# Patient Record
Sex: Male | Born: 1990 | Race: White | Hispanic: No | Marital: Single | Smoking: Current every day smoker
Health system: Southern US, Community
[De-identification: ages and names within clinical notes are randomized; demographics above are authoritative.]

## PROBLEM LIST (undated history)

## (undated) DIAGNOSIS — T7840XA Allergy, unspecified, initial encounter: Secondary | ICD-10-CM

## (undated) DIAGNOSIS — K219 Gastro-esophageal reflux disease without esophagitis: Secondary | ICD-10-CM

## (undated) HISTORY — DX: Allergy, unspecified, initial encounter: T78.40XA

---

## 2010-11-14 ENCOUNTER — Emergency Department: Payer: Self-pay | Admitting: Unknown Physician Specialty

## 2011-12-26 ENCOUNTER — Emergency Department: Payer: Self-pay | Admitting: Emergency Medicine

## 2013-10-12 ENCOUNTER — Emergency Department: Payer: Self-pay | Admitting: Emergency Medicine

## 2013-11-12 ENCOUNTER — Emergency Department: Payer: Self-pay | Admitting: Emergency Medicine

## 2014-02-14 ENCOUNTER — Emergency Department: Payer: Self-pay | Admitting: Student

## 2014-06-20 ENCOUNTER — Emergency Department: Payer: Self-pay | Admitting: Emergency Medicine

## 2014-07-13 ENCOUNTER — Emergency Department: Payer: Self-pay | Admitting: Emergency Medicine

## 2015-10-27 ENCOUNTER — Emergency Department
Admission: EM | Admit: 2015-10-27 | Discharge: 2015-10-27 | Disposition: A | Discharge status: Home / Self Care | Payer: Self-pay | Attending: Emergency Medicine | Admitting: Emergency Medicine

## 2015-10-27 DIAGNOSIS — L02414 Cutaneous abscess of left upper limb: Secondary | ICD-10-CM | POA: Insufficient documentation

## 2015-10-27 MED ORDER — IBUPROFEN 600 MG PO TABS: 600.0000 mg | ORAL_TABLET | Freq: Four times a day (QID) | ORAL | Status: DC | PRN

## 2015-10-27 MED ORDER — SULFAMETHOXAZOLE-TRIMETHOPRIM 800-160 MG PO TABS: 1.0000 | ORAL_TABLET | Freq: Two times a day (BID) | ORAL | Status: DC

## 2015-10-27 MED ORDER — TRAMADOL HCL 50 MG PO TABS: 50.0000 mg | ORAL_TABLET | Freq: Four times a day (QID) | ORAL | Status: DC | PRN

## 2015-10-27 MED ORDER — TRAMADOL HCL 50 MG PO TABS
50.0000 mg | ORAL_TABLET | Freq: Once | ORAL | Status: AC
  Administered 2015-10-27: 50 mg via ORAL
  Filled 2015-10-27: qty 1

## 2015-10-27 MED ORDER — IBUPROFEN 800 MG PO TABS
800.0000 mg | ORAL_TABLET | Freq: Once | ORAL | Status: AC
  Administered 2015-10-27: 800 mg via ORAL
  Filled 2015-10-27: qty 1

## 2015-10-27 MED ORDER — SULFAMETHOXAZOLE-TRIMETHOPRIM 800-160 MG PO TABS
1.0000 | ORAL_TABLET | Freq: Once | ORAL | Status: AC
  Administered 2015-10-27: 1 via ORAL
  Filled 2015-10-27: qty 1

## 2015-10-27 NOTE — ED Notes (Signed)
Pt ambulatory to triage with no difficulty. Pt reports about 3 days he noticed an area to his left forearm that he thought was a spider bite. Pt has red raised area to left forearm that looks infected. Pt states he tried to "pop it" and nothing came out then it started swelling up.
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
## 2015-10-27 NOTE — ED Provider Notes (Signed)
[facility] Emergency Department Provider Note   ____________________________________________  Time seen: Approximately 8:17 PM  I have reviewed the triage vital signs and the nursing notes.   HISTORY  Chief Complaint Abscess   HPI Ralph Edwards is a 25 y.o. male patient complain abscess to left lateral forearm. Patient stated 3 days ago he tried a ruptured abscess without success. Patient stated this been increased swelling since he attempted to rupture the lesion. Patient rates pain as a 5/10. No other palliative measures for his complaint. Patient described a pain as "achy". No active discharge at this time.   No past medical history on file.  There are no active problems to display for this patient.   No past surgical history on file.  Current Outpatient Rx  Name  Route  Sig  Dispense  Refill  . ibuprofen (ADVIL,MOTRIN) 600 MG tablet   Oral   Take 1 tablet (600 mg total) by mouth every 6 (six) hours as needed.   30 tablet   0   . sulfamethoxazole-trimethoprim (BACTRIM DS,SEPTRA DS) 800-160 MG tablet   Oral   Take 1 tablet by mouth 2 (two) times daily.   20 tablet   0   . traMADol (ULTRAM) 50 MG tablet   Oral   Take 1 tablet (50 mg total) by mouth every 6 (six) hours as needed for moderate pain.   12 tablet   0     Allergies Review of patient's allergies indicates no known allergies.  No family history on file.  Social History Social History  Substance Use Topics  . Smoking status: Not on file  . Smokeless tobacco: Not on file  . Alcohol Use: Not on file    Review of Systems Constitutional: No fever/chills Eyes: No visual changes. ENT: No sore throat. Cardiovascular: Denies chest pain. Respiratory: Denies shortness of breath. Gastrointestinal: No abdominal pain.  No nausea, no vomiting.  No diarrhea.  No constipation. Genitourinary: Negative for dysuria. Musculoskeletal: Negative for back pain. Skin: Negative  for rash.Papular lesions left forearm on erythematous base Neurological: Negative for headaches, focal weakness or numbness.    ____________________________________________   PHYSICAL EXAM:  VITAL SIGNS: ED Triage Vitals  Enc Vitals Group     BP 10/27/15 1933 130/81 mmHg     Pulse Rate 10/27/15 1933 76     Resp 10/27/15 1933 18     Temp 10/27/15 1933 98.1 F (36.7 C)     Temp Source 10/27/15 1933 Oral     SpO2 10/27/15 1933 99 %     Weight 10/27/15 1933 135 lb (61.236 kg)     Height 10/27/15 1933 6' (1.829 m)     Head Cir --      Peak Flow --      Pain Score 10/27/15 1934 5     Pain Loc --      Pain Edu? --      Excl. in GC? --     Constitutional: Alert and oriented. Well appearing and in no acute distress. Eyes: Conjunctivae are normal. PERRL. EOMI. Head: Atraumatic. Nose: No congestion/rhinnorhea. Mouth/Throat: Mucous membranes are moist.  Oropharynx non-erythematous. Neck: No stridor.  No cervical spine tenderness to palpation. Hematological/Lymphatic/Immunilogical: No cervical lymphadenopathy. Cardiovascular: Normal rate, regular rhythm. Grossly normal heart sounds.  Good peripheral circulation. Respiratory: Normal respiratory effort.  No retractions. Lungs CTAB. Gastrointestinal: Soft and nontender. No distention. No abdominal bruits. No CVA tenderness. Musculoskeletal: No lower extremity tenderness nor edema.  No joint  effusions. Neurologic:  Normal speech and language. No gross focal neurologic deficits are appreciated. No gait instability. Skin:  Skin is warm, dry and intact. Papular lesion on erythematous base. Lesion is nonfluctuant. Psychiatric: Mood and affect are normal. Speech and behavior are normal.  ____________________________________________   LABS (all labs ordered are listed, but only abnormal results are displayed)  Labs Reviewed - No data to  display ____________________________________________  EKG   ____________________________________________  RADIOLOGY   ____________________________________________   PROCEDURES  Procedure(s) performed: None  Critical Care performed: No  ____________________________________________   INITIAL IMPRESSION / ASSESSMENT AND PLAN / ED COURSE  Pertinent labs & imaging results that were available during my care of the patient were reviewed by me and considered in my medical decision making (see chart for details).  Abscess left forearm. Discussed with patient rationale for not I&D at this time. Patient given discharge Instructions. Patient a prescription for Bactrim DS, ibuprofen, and tramadol. Patient [facility]. ____________________________________________   FINAL CLINICAL IMPRESSION(S) / ED DIAGNOSES  Final diagnoses:  Abscess of left forearm      NEW MEDICATIONS STARTED DURING THIS VISIT:  New Prescriptions   IBUPROFEN (ADVIL,MOTRIN) 600 MG TABLET    Take 1 tablet (600 mg total) by mouth every 6 (six) hours as needed.   SULFAMETHOXAZOLE-TRIMETHOPRIM (BACTRIM DS,SEPTRA DS) 800-160 MG TABLET    Take 1 tablet by mouth 2 (two) times daily.   TRAMADOL (ULTRAM) 50 MG TABLET    Take 1 tablet (50 mg total) by mouth every 6 (six) hours as needed for moderate pain.     Note:  This document was prepared using Dragon voice recognition software and may include unintentional dictation errors.    Joni ReiningRonald K Earlisha Sharples, PA-C 10/27/15 2023  Jennye MoccasinBrian S Quigley, MD 10/27/15 2051

## 2015-10-27 NOTE — ED Notes (Signed)
Pt reports he tried to burst abscess himself apprx 3 days ago, and swelling has increased dramatically since

## 2015-10-27 NOTE — Discharge Instructions (Signed)
Abscess °An abscess (boil or furuncle) is an infected area on or under the skin. This area is filled with yellowish-white fluid (pus) and other material (debris). °HOME CARE  °· Only take medicines as told by your doctor. °· If you were given antibiotic medicine, take it as directed. Finish the medicine even if you start to feel better. °· If gauze is used, follow your doctor's directions for changing the gauze. °· To avoid spreading the infection: °¨ Keep your abscess covered with a bandage. °¨ Wash your hands well. °¨ Do not share personal care items, towels, or whirlpools with others. °¨ Avoid skin contact with others. °· Keep your skin and clothes clean around the abscess. °· Keep all doctor visits as told. °GET HELP RIGHT AWAY IF:  °· You have more pain, puffiness (swelling), or redness in the wound site. °· You have more fluid or blood coming from the wound site. °· You have muscle aches, chills, or you feel sick. °· You have a fever. °MAKE SURE YOU:  °· Understand these instructions. °· Will watch your condition. °· Will get help right away if you are not doing well or get worse. °  °This information is not intended to replace advice given to you by your health care provider. Make sure you discuss any questions you have with your health care provider. °  °Document Released: 10/29/2007 Document Revised: 11/11/2011 Document Reviewed: 07/26/2011 °Elsevier Interactive Patient Education ©2016 [employer] ° °

## 2015-10-27 NOTE — ED Notes (Signed)
Reviewed d/c instructions, follow-up care, prescriptions with pt. Pt verbalized understanding. Gave pt work note

## 2016-02-25 ENCOUNTER — Emergency Department
Admission: EM | Admit: 2016-02-25 | Discharge: 2016-02-25 | Disposition: A | Discharge status: Home / Self Care | Payer: Self-pay | Attending: Emergency Medicine | Admitting: Emergency Medicine

## 2016-02-25 ENCOUNTER — Emergency Department: Payer: Self-pay

## 2016-02-25 ENCOUNTER — Encounter: Payer: Self-pay | Admitting: *Deleted

## 2016-02-25 DIAGNOSIS — S20212A Contusion of left front wall of thorax, initial encounter: Secondary | ICD-10-CM | POA: Insufficient documentation

## 2016-02-25 DIAGNOSIS — F17200 Nicotine dependence, unspecified, uncomplicated: Secondary | ICD-10-CM | POA: Insufficient documentation

## 2016-02-25 DIAGNOSIS — X58XXXA Exposure to other specified factors, initial encounter: Secondary | ICD-10-CM | POA: Insufficient documentation

## 2016-02-25 DIAGNOSIS — Y999 Unspecified external cause status: Secondary | ICD-10-CM | POA: Insufficient documentation

## 2016-02-25 DIAGNOSIS — Y939 Activity, unspecified: Secondary | ICD-10-CM | POA: Insufficient documentation

## 2016-02-25 DIAGNOSIS — Y929 Unspecified place or not applicable: Secondary | ICD-10-CM | POA: Insufficient documentation

## 2016-02-25 MED ORDER — IBUPROFEN 800 MG PO TABS
800.0000 mg | ORAL_TABLET | Freq: Once | ORAL | Status: AC
  Administered 2016-02-25: 800 mg via ORAL
  Filled 2016-02-25: qty 1

## 2016-02-25 MED ORDER — IBUPROFEN 800 MG PO TABS: 800.0000 mg | ORAL_TABLET | Freq: Three times a day (TID) | ORAL | 0 refills | Status: DC | PRN
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
## 2016-02-25 NOTE — Discharge Instructions (Signed)
Return to the emergency department for any increase in chest pain, shortness of breath, difficult to breathing any worsening symptoms urgent changes in her health. Take ibuprofen as needed for pain.

## 2016-11-03 ENCOUNTER — Ambulatory Visit (INDEPENDENT_AMBULATORY_CARE_PROVIDER_SITE_OTHER): Payer: Self-pay

## 2016-11-03 ENCOUNTER — Ambulatory Visit
Admission: EM | Admit: 2016-11-03 | Discharge: 2016-11-03 | Disposition: A | Discharge status: Home / Self Care | Payer: Self-pay | Attending: Family Medicine | Admitting: Family Medicine

## 2016-11-03 DIAGNOSIS — K219 Gastro-esophageal reflux disease without esophagitis: Secondary | ICD-10-CM

## 2016-11-03 DIAGNOSIS — R197 Diarrhea, unspecified: Secondary | ICD-10-CM

## 2016-11-03 DIAGNOSIS — M7918 Myalgia, other site: Secondary | ICD-10-CM

## 2016-11-03 DIAGNOSIS — E876 Hypokalemia: Secondary | ICD-10-CM

## 2016-11-03 DIAGNOSIS — M791 Myalgia: Secondary | ICD-10-CM

## 2016-11-03 DIAGNOSIS — R109 Unspecified abdominal pain: Secondary | ICD-10-CM

## 2016-11-03 HISTORY — DX: Gastro-esophageal reflux disease without esophagitis: K21.9

## 2016-11-03 LAB — CBC WITH DIFFERENTIAL/PLATELET
BASOS PCT: 0 %
Basophils Absolute: 0 10*3/uL (ref 0–0.1)
EOS ABS: 0.1 10*3/uL (ref 0–0.7)
Eosinophils Relative: 1 %
HCT: 47.4 % (ref 40.0–52.0)
HEMOGLOBIN: 16.1 g/dL (ref 13.0–18.0)
Lymphocytes Relative: 25 %
Lymphs Abs: 2.4 10*3/uL (ref 1.0–3.6)
MCH: 30.4 pg (ref 26.0–34.0)
MCHC: 34 g/dL (ref 32.0–36.0)
MCV: 89.4 fL (ref 80.0–100.0)
MONOS PCT: 7 %
Monocytes Absolute: 0.7 10*3/uL (ref 0.2–1.0)
NEUTROS PCT: 67 %
Neutro Abs: 6.6 10*3/uL — ABNORMAL HIGH (ref 1.4–6.5)
PLATELETS: 284 10*3/uL (ref 150–440)
RBC: 5.3 MIL/uL (ref 4.40–5.90)
RDW: 13.3 % (ref 11.5–14.5)
WBC: 9.8 10*3/uL (ref 3.8–10.6)

## 2016-11-03 LAB — COMPREHENSIVE METABOLIC PANEL
ALBUMIN: 4.5 g/dL (ref 3.5–5.0)
ALK PHOS: 76 U/L (ref 38–126)
ALT: 16 U/L — ABNORMAL LOW (ref 17–63)
ANION GAP: 11 (ref 5–15)
AST: 22 U/L (ref 15–41)
BUN: 10 mg/dL (ref 6–20)
CALCIUM: 9 mg/dL (ref 8.9–10.3)
CHLORIDE: 100 mmol/L — AB (ref 101–111)
CO2: 25 mmol/L (ref 22–32)
Creatinine, Ser: 0.91 mg/dL (ref 0.61–1.24)
GFR calc Af Amer: >60 mL/min (ref >60)
GFR calc non Af Amer: >60 mL/min (ref >60)
GLUCOSE: 104 mg/dL — AB (ref 65–99)
Potassium: 3.4 mmol/L — ABNORMAL LOW (ref 3.5–5.1)
SODIUM: 136 mmol/L (ref 135–145)
Total Bilirubin: 1.5 mg/dL — ABNORMAL HIGH (ref 0.3–1.2)
Total Protein: 7.7 g/dL (ref 6.5–8.1)

## 2016-11-03 MED ORDER — POTASSIUM CHLORIDE CRYS ER 10 MEQ PO TBCR
10.0000 meq | EXTENDED_RELEASE_TABLET | Freq: Once | ORAL | Status: AC
  Administered 2016-11-03: 10 meq via ORAL

## 2016-11-03 MED ORDER — OMEPRAZOLE 20 MG PO CPDR: 20.0000 mg | DELAYED_RELEASE_CAPSULE | Freq: Every day | ORAL | 0 refills | Status: DC

## 2016-11-03 NOTE — Discharge Instructions (Signed)
Take medication as prescribed. Rest. Drink plenty of fluids. Eat healthy diet. Avoid acid reflux triggers.   Follow up with your primary care physician for recheck of labs of bilirubin and potassium and follow up, as discussed. Return to Urgent care or Emergency room for new or worsening concerns.

## 2016-11-03 NOTE — ED Triage Notes (Addendum)
Pt c/o yellowing of the eyes, and his upper left abdomen is tender and he mentions that it appears to be swollen to him. His last BM was this morning and he states it was diarrhea and he has had diarrhea the last couple days.

## 2016-11-03 NOTE — ED Provider Notes (Signed)
[facility] ____________________________________________  Time seen: Approximately 9:04 PM  I have reviewed the triage vital signs and the nursing notes.   HISTORY  Chief Complaint Abdominal Pain   HPI Ralph Edwards is a 26 y.o. male  presenting with friend at bedside for evaluation of left upper abdominal pain in the location of what he describes as a visible bulge present present 1 day. Patient states that currently he is not having any pain and states currently he feels fine. States that this causes him a lot of pain earlier today and resolved after he left work. Denies aggravating or alleviating factors. Reports he did have 2 episodes of loose stool today, states it is normal-appearing, no melena, hematochezia or abnormal colors. No nausea or vomiting. Patient has not taken anything over-the-counter today. States currently he is pain-free and feels fine. Denies any other diarrheal episodes.  Patient states that he was having left upper abdominal pain and states that he noticed a bulge and states that the pain was the only area of pain. States that it hurt directly on the bulge and nowhere else. Denies any fall, injury or trauma to chest or abdomen or elsewhere. States he noticed similar about a month ago which resolved after a day without any intervention. Patient reports a month ago he had pain when he noticed the bulge, reports the pain went away after a day, but the bulge never resolved. Reports drank fluids well today, slight decrease in appetite. Reports knows he does not drink as much water as he needs to. Patient states that today he started to look up his symptoms on line and became concerned when he pulled on his bilateral lower eyelids and concern of a yellowish coloration that he has not previously noticed.  Patient also reports that he has had acid reflux issues for several years. Patient reports he usually has a lot of belching and burping of the acidic  tasting material in his mouth, worse in the morning and after eating acidic foods. Has never previously take any medications for this. States no acute worsening of this.  Denies chest pain, shortness of breath, dysuria, extremity pain, extremity swelling or rash. Denies recent sickness. Denies recent antibiotic use.    Past Medical History:  Diagnosis Date  . Acid reflux     There are no active problems to display for this patient.   History reviewed. No pertinent surgical history.   No current facility-administered medications for this encounter.   Current Outpatient Prescriptions:   .  omeprazole (PRILOSEC) 20 MG capsule, Take 1 capsule (20 mg total) by mouth daily., Disp: 30 capsule, Rfl: 0  Allergies Patient has no known allergies.   family history Dad; acid reflux  Social History Social History  Substance Use Topics  . Smoking status: Current Every Day Smoker  . Smokeless tobacco: Never Used  . Alcohol use Yes    Review of Systems Constitutional: No fever/chills Eyes: No visual changes. ENT: No sore throat. Cardiovascular: Denies chest pain. Respiratory: Denies shortness of breath. Gastrointestinal: As above.No constipation. Genitourinary: Negative for dysuria. Musculoskeletal: Negative for back pain. Skin: Negative for rash.  ____________________________________________   PHYSICAL EXAM:  VITAL SIGNS: ED Triage Vitals  Enc Vitals Group     BP 11/03/16 1933 134/81     Pulse Rate 11/03/16 1933 94     Resp 11/03/16 1933 18     Temp 11/03/16 1933 98.3 F (36.8 C)     Temp Source 11/03/16 1933 Oral  SpO2 11/03/16 1933 100 %     Weight 11/03/16 1933 145 lb (65.8 kg)     Height 11/03/16 1933 6' (1.829 m)     Head Circumference --      Peak Flow --      Pain Score 11/03/16 1935 9     Pain Loc --      Pain Edu? --      Excl. in GC? --     Constitutional: Alert and oriented. Well appearing and in no acute distress. Eyes: Conjunctivae are normal.  No injection. No drainage. No swelling or tenderness bilaterally. Bilaterally- minimal yellowish coloration present when lower eyelids pulled downwards, no other yellowish discoloration. No asymmetry noted.  ENT      Head: Normocephalic and atraumatic.      Nose: No congestion/rhinnorhea.      Mouth/Throat: Mucous membranes are moist.Oropharynx non-erythematous. Neck: No stridor. Supple without meningismus.  Hematological/Lymphatic/Immunilogical: No cervical lymphadenopathy. Cardiovascular: Normal rate, regular rhythm. Grossly normal heart sounds.  Good peripheral circulation. Respiratory: Normal respiratory effort without tachypnea nor retractions. Breath sounds are clear and equal bilaterally. No wheezes, rales, rhonchi. Gastrointestinal: Soft and nontender. No distention. Normal Bowel sounds. No CVA tenderness. Musculoskeletal:   No midline cervical, thoracic or lumbar tenderness to palpation.  Chest nontender to palpation. No rib tenderness noted.  Left upper abdomen just left of xiphoid process minimal asymmetry noted of abdominal musculature left larger than right, area nontender, no point bony tenderness, no mass palpated, no erythema, no rash, minimal asymmetry also noted with patient flexing abdominal musculature with no tenderness noted.   Neurologic:  Normal speech and language. Speech is normal. No gait instability.  Skin:  Skin is warm, dry Psychiatric: Mood and affect are normal. Speech and behavior are normal. Patient exhibits appropriate insight and judgment   ___________________________________________   LABS (all labs ordered are listed, but only abnormal results are displayed)  Labs Reviewed  CBC WITH DIFFERENTIAL/PLATELET - Abnormal; Notable for the following:       Result Value   Neutro Abs 6.6 (*)    All other components within normal limits  COMPREHENSIVE METABOLIC PANEL - Abnormal; Notable for the following:    Potassium 3.4 (*)    Chloride 100 (*)    Glucose, Bld  104 (*)    ALT 16 (*)    Total Bilirubin 1.5 (*)    All other components within normal limits    RADIOLOGY  Dg Chest 2 View  Result Date: 11/03/2016 CLINICAL DATA:  Lower mid anterior left rib pain EXAM: CHEST  2 VIEW COMPARISON:  02/25/2016 FINDINGS: The lungs are clear without focal pneumonia, edema, pneumothorax or pleural effusion. Biapical pleuroparenchymal scarring is stable. The cardiopericardial silhouette is within normal limits for size. The visualized bony structures of the thorax are intact. IMPRESSION: Stable exam.  No acute cardiopulmonary findings. Electronically Signed   By: Kennith CenterEric  Mansell M.D.   On: 11/03/2016 20:49   Dg Abdomen 1 View  Result Date: 11/03/2016 CLINICAL DATA:  Upper abdominal pain and lower mid anterior rib pain 1 month. Diarrhea beginning today. Some shortness-of-breath. EXAM: ABDOMEN - 1 VIEW COMPARISON:  None. FINDINGS: Bowel gas pattern is nonobstructive. No free peritoneal air. No mass or mass effect. Few tiny pelvic calcifications likely phleboliths. Bony structures are normal. IMPRESSION: Nonobstructive bowel gas pattern. Electronically Signed   By: Elberta Fortisaniel  Boyle M.D.   On: 11/03/2016 20:35   ____________________________________________   PROCEDURES Procedures    INITIAL IMPRESSION / ASSESSMENT AND PLAN /  ED COURSE  Pertinent labs & imaging results that were available during my care of the patient were reviewed by me and considered in my medical decision making (see chart for details).  Very well-appearing patient. No acute distress. Stable appearing. Patient denies any complaints at this time is currently asymptomatic per patient. Patient expressed concern over pain and appearance of asymmetrical bulge and left upper abdomen. Minimal asymmetry noted to left abdominal musculature immediately left of xiphoid process, nontender and no mass palpated. 2 episodes of loose stool today. Patient with history of chronic acid reflux symptoms, not previously  only medications. Chest and abdominal x-ray results per radiologist as above. Labs reviewed and discussed in detail with patient and family. Bilirubin 1.5, counseled regarding monitor, increase water intake as patient states has not been drinking water as normal and follow-up and lab in 1 week. Potassium 3.4, 10 mEq potassium given once in urgent care, encouraged dietary adjustments and monitoring diarrhea. Suspect asymmetry noted to be muscle process. Also as patient with chronic history of acid reflux symptoms without currently medications will start patient on oral daily omeprazole. Encourage rest, fluids, supportive care. Encouraged primary follow-up in one week, discussed sooner return parameters with patient and friend.Discussed indication, risks and benefits of medications with patient.   Discussed follow up with Primary care physician this week. Discussed follow up and return parameters including no resolution or any worsening concerns. Patient verbalized understanding and agreed to plan.   ____________________________________________   FINAL CLINICAL IMPRESSION(S) / ED DIAGNOSES  Final diagnoses:  Abdominal muscle pain  Diarrhea, unspecified type  Hypokalemia  Hyperbilirubinemia  Gastroesophageal reflux disease, esophagitis presence not specified     Discharge Medication List as of 11/03/2016  8:59 PM    START taking these medications   Details  omeprazole (PRILOSEC) 20 MG capsule Take 1 capsule (20 mg total) by mouth daily., Starting Mon 11/03/2016, Until Wed 12/03/2016, Normal        Note: This dictation was prepared with Dragon dictation along with smaller phrase technology. Any transcriptional errors that result from this process are unintentional.         Renford Dills, NP 11/03/16 2128

## 2017-06-15 ENCOUNTER — Other Ambulatory Visit: Payer: Self-pay

## 2017-06-15 ENCOUNTER — Encounter: Payer: Self-pay | Admitting: Emergency Medicine

## 2017-06-15 DIAGNOSIS — F17200 Nicotine dependence, unspecified, uncomplicated: Secondary | ICD-10-CM | POA: Insufficient documentation

## 2017-06-15 DIAGNOSIS — J020 Streptococcal pharyngitis: Secondary | ICD-10-CM | POA: Insufficient documentation

## 2017-06-15 DIAGNOSIS — J069 Acute upper respiratory infection, unspecified: Secondary | ICD-10-CM | POA: Insufficient documentation

## 2017-06-15 DIAGNOSIS — R0981 Nasal congestion: Secondary | ICD-10-CM | POA: Insufficient documentation

## 2017-06-15 NOTE — ED Triage Notes (Signed)
Patient ambulatory to triage with steady gait, without difficulty or distress noted; pt reports since Friday having sinus congestion and yellow prod cough

## 2017-06-16 ENCOUNTER — Emergency Department: Payer: Self-pay

## 2017-06-16 ENCOUNTER — Emergency Department
Admission: EM | Admit: 2017-06-16 | Discharge: 2017-06-16 | Disposition: A | Discharge status: Home / Self Care | Payer: Self-pay | Attending: Emergency Medicine | Admitting: Emergency Medicine

## 2017-06-16 DIAGNOSIS — J069 Acute upper respiratory infection, unspecified: Secondary | ICD-10-CM

## 2017-06-16 DIAGNOSIS — J020 Streptococcal pharyngitis: Secondary | ICD-10-CM

## 2017-06-16 LAB — INFLUENZA PANEL BY PCR (TYPE A & B)
INFLBPCR: NEGATIVE
Influenza A By PCR: NEGATIVE

## 2017-06-16 LAB — GROUP A STREP BY PCR: Group A Strep by PCR: DETECTED — AB

## 2017-06-16 MED ORDER — IBUPROFEN 600 MG PO TABS
600.0000 mg | ORAL_TABLET | Freq: Once | ORAL | Status: AC
  Administered 2017-06-16: 600 mg via ORAL
  Filled 2017-06-16: qty 1

## 2017-06-16 MED ORDER — AMOXICILLIN 875 MG PO TABS: 875.0000 mg | ORAL_TABLET | Freq: Two times a day (BID) | ORAL | 10 refills | Status: AC

## 2017-06-16 MED ORDER — AMOXICILLIN 500 MG PO CAPS
1000.0000 mg | ORAL_CAPSULE | Freq: Once | ORAL | Status: AC
  Administered 2017-06-16: 1000 mg via ORAL
  Filled 2017-06-16: qty 2

## 2017-06-16 NOTE — Discharge Instructions (Signed)
Please follow up with your primary care physician or the acute care clinic

## 2017-06-16 NOTE — ED Provider Notes (Signed)
[facility] Emergency Department Provider Note   ____________________________________________   First MD Initiated Contact with Patient 06/16/17 [id number]     (approximate)  I have reviewed the triage vital signs and the nursing notes.   HISTORY  Chief Complaint Cough and Nasal Congestion    HPI Ralph Edwards is a 27 y.o. male who comes into the hospital today with some coughing and nasal congestion.  He reports that his cough is been productive of yellow phlegm.  He has had some body aches and sore throat as well.  He felt hot earlier but did not take his temperature.  The patient states that he has been unable to get out of bed.  His cough started on Tuesday but he reports that the symptoms got worse in the last couple of days.  He also states that he has a knot on his throat for the last 6 months and he reports that he has lost some weight.  He has some swollen lymph nodes.  The patient does not have a primary care physician.  He has been taking TheraFlu which she reports works but he still states that his pain is a 6 out of 10 in intensity.  The patient is here today for evaluation.  Past Medical History:  Diagnosis Date  . Acid reflux     There are no active problems to display for this patient.   History reviewed. No pertinent surgical history.  Prior to Admission medications   Medication Sig Start Date End Date Taking? Authorizing Provider  amoxicillin (AMOXIL) 500 MG tablet Take 500 mg by mouth 3 (three) times daily.    [provider]  amoxicillin (AMOXIL) 875 MG tablet Take 1 tablet (875 mg total) by mouth 2 (two) times daily. 06/16/17   Rebecka ApleyWebster, Johnnye Sandford P, MD  omeprazole (PRILOSEC) 20 MG capsule Take 1 capsule (20 mg total) by mouth daily. 11/03/16 12/03/16  Renford DillsMiller, Lindsey, NP    Allergies Patient has no known allergies.  No family history on file.  Social History Social History   Tobacco Use  . Smoking status: Current  Every Day Smoker  . Smokeless tobacco: Never Used  Substance Use Topics  . Alcohol use: Yes  . Drug use: Not on file    Review of Systems  Constitutional:  fever/chills Eyes: No visual changes. ENT:  sore throat. Cardiovascular: Denies chest pain. Respiratory: cough and shortness of breath. Gastrointestinal: No abdominal pain.  No nausea, no vomiting.  No diarrhea.  No constipation. Genitourinary: Negative for dysuria. Musculoskeletal: body aches. Skin: Negative for rash. Neurological: headache   ____________________________________________   PHYSICAL EXAM:  VITAL SIGNS: ED Triage Vitals  Enc Vitals Group     BP 06/15/17 2354 (!) 138/91     Pulse Rate 06/15/17 2354 100     Resp 06/15/17 2354 20     Temp 06/15/17 2354 97.8 F (36.6 C)     Temp Source 06/15/17 2354 Oral     SpO2 06/15/17 2354 100 %     Weight 06/15/17 2355 140 lb (63.5 kg)     Height 06/15/17 2355 6' (1.829 m)     Head Circumference --      Peak Flow --      Pain Score 06/16/17 0306 0     Pain Loc --      Pain Edu? --      Excl. in GC? --     Constitutional: Alert and oriented. Well appearing and in moderate  distress. Eyes: Conjunctivae are normal. PERRL. EOMI. Head: Atraumatic. Nose: No congestion/rhinnorhea. Mouth/Throat: Mucous membranes are moist.  Oropharynx mildly erythematous with no exudate Hematological/Lymphatic/Immunilogical:  cervical lymphadenopathy. Cardiovascular: Normal rate, regular rhythm. Grossly normal heart sounds.  Good peripheral circulation. Respiratory: Normal respiratory effort.  No retractions. Lungs CTAB. Gastrointestinal: Soft and nontender. No distention. No abdominal bruits.  Musculoskeletal: No lower extremity tenderness nor edema.   Neurologic:  Normal speech and language.  Skin:  Skin is warm, dry and intact.  Psychiatric: Mood and affect are normal.   ____________________________________________   LABS (all labs ordered are listed, but only abnormal  results are displayed)  Labs Reviewed  GROUP A STREP BY PCR - Abnormal; Notable for the following components:      Result Value   Group A Strep by PCR DETECTED (*)    All other components within normal limits  INFLUENZA PANEL BY PCR (TYPE A & B)   ____________________________________________  EKG  none ____________________________________________  RADIOLOGY  Dg Chest 2 View  Result Date: 06/16/2017 CLINICAL DATA:  Cough EXAM: CHEST  2 VIEW COMPARISON:  Chest radiograph 11/03/2016 FINDINGS: The heart size and mediastinal contours are within normal limits. Both lungs are clear. The visualized skeletal structures are unremarkable. IMPRESSION: No active cardiopulmonary disease. Electronically Signed   By: Deatra Robinson M.D.   On: 06/16/2017 00:21    ____________________________________________   PROCEDURES  Procedure(s) performed: None  Procedures  Critical Care performed: No  ____________________________________________   INITIAL IMPRESSION / ASSESSMENT AND PLAN / ED COURSE  As part of my medical decision making, I reviewed the following data within the electronic MEDICAL RECORD NUMBER Notes from prior ED visits and [place] Controlled Substance Database   This is a 27 year old male who comes into the hospital today with some coughing and nasal congestion.  The patient has some sore throat and some body aches.  My differential diagnosis includes viral upper respiratory infection, influenza, strep throat, pneumonia  The patient was sent for a chest x-ray but it did not show a pneumonia.  I will order a flu swab and strep swab.  The patient will receive a dose of ibuprofen.  His vital signs are unremarkable.  He will be reassessed once I received his results.     The patient strep came back positive.  His influenza was negative.  I gave the patient a dose of amoxicillin.  He will be discharged home to follow-up with the acute care  clinic. ____________________________________________   FINAL CLINICAL IMPRESSION(S) / ED DIAGNOSES  Final diagnoses:  Strep throat  Acute upper respiratory infection     ED Discharge Orders        Ordered    amoxicillin (AMOXIL) 875 MG tablet  2 times daily     06/16/17 0502       Note:  This document was prepared using Dragon voice recognition software and may include unintentional dictation errors.    Rebecka Apley, MD 06/16/17 [phone]

## 2020-09-27 ENCOUNTER — Other Ambulatory Visit: Payer: Self-pay

## 2020-09-27 ENCOUNTER — Ambulatory Visit: Payer: Self-pay | Admitting: Family Medicine

## 2020-09-27 ENCOUNTER — Encounter: Payer: Self-pay | Admitting: Family Medicine

## 2020-09-27 DIAGNOSIS — Z113 Encounter for screening for infections with a predominantly sexual mode of transmission: Secondary | ICD-10-CM

## 2020-09-27 LAB — GRAM STAIN

## 2020-09-27 NOTE — Progress Notes (Signed)
Gram Stain results reviewed.  No treatment needed per SO. Berdie Ogren, RN

## 2020-09-27 NOTE — Progress Notes (Signed)
   [facility] Department STI clinic/screening visit  Subjective:  Ralph Edwards is a 30 y.o. male being seen today for an STI screening visit. The patient reports they do not have symptoms.    Patient has the following medical conditions:  There are no problems to display for this patient.    Chief Complaint  Patient presents with  . SEXUALLY TRANSMITTED DISEASE    screening    HPI  Patient reports here for screening denies s/sx    See flowsheet for further details and programmatic requirements.    The following portions of the patient's history were reviewed and updated as appropriate: allergies, current medications, past medical history, past social history, past surgical history and problem list.  Objective:  There were no vitals filed for this visit.  Physical Exam Constitutional:      Appearance: Normal appearance.  HENT:     Head: Normocephalic.     Mouth/Throat:     Mouth: Mucous membranes are moist.     Dentition: Normal dentition.     Pharynx: Oropharynx is clear. No oropharyngeal exudate.  Pulmonary:     Effort: Pulmonary effort is normal.  Genitourinary:    Penis: Normal.      Testes: Normal.     Comments: No lice, nits, or pest, no lesions or odor discharge.  Denies pain or tenderness with paplation of testicles.  No lesions, ulcers or masses present.    Musculoskeletal:     Cervical back: Normal range of motion.  Lymphadenopathy:     Cervical: No cervical adenopathy.  Skin:    General: Skin is warm and dry.     Findings: No bruising, erythema, lesion or rash.  Neurological:     Mental Status: He is alert and oriented to person, place, and time.  Psychiatric:        Mood and Affect: Mood normal.        Behavior: Behavior normal.       Assessment and Plan:  Ralph Edwards is a 30 y.o. male presenting to the [facility] Department for STI screening  1. Screening examination for venereal disease  -  HIV/HCV [place] [facility] - Syphilis Serology, [facility] - Gonococcus culture - Gram stain  Patient does not have STI symptoms Patient accepted all screenings including  Gram stain, urethral GC and bloodwork for HIV/RPR.  Patient meets criteria for HepB screening? No. Ordered? No -  does not meet critera  Patient meets criteria for HepC screening? Yes. Ordered? Yes Recommended condom use with all sex Discussed importance of condom use for STI prevent  RN: Treat gram stain per standing order Discussed time line for [facility] results and that patient will be called with positive results and encouraged patient to call if he had not heard in 2 weeks Recommended returning for continued or worsening symptoms.    No follow-ups on file.  No future appointments.  Wendi Snipes, FNP

## 2020-10-01 LAB — GONOCOCCUS CULTURE

## 2020-10-02 LAB — HM HEPATITIS C SCREENING LAB: HM Hepatitis Screen: NEGATIVE

## 2020-10-02 LAB — HM HIV SCREENING LAB: HM HIV Screening: NEGATIVE

## 2020-10-18 ENCOUNTER — Telehealth: Payer: Self-pay | Admitting: Family Medicine

## 2020-10-18 NOTE — Telephone Encounter (Signed)
Call to client and busy signal heard. RN will re-attempt call in 10 minutes. Jossie Ng, RN

## 2020-10-18 NOTE — Telephone Encounter (Signed)
Call to number of record and once again immediate busy signal heard. Jossie Ng, RN

## 2020-10-18 NOTE — Telephone Encounter (Signed)
I need my lab results.

## 2020-10-19 ENCOUNTER — Telehealth: Payer: Self-pay

## 2021-05-06 ENCOUNTER — Ambulatory Visit: Payer: BC Managed Care – PPO | Admitting: Nurse Practitioner

## 2021-05-06 ENCOUNTER — Telehealth: Payer: Self-pay

## 2021-05-06 ENCOUNTER — Other Ambulatory Visit: Payer: Self-pay

## 2021-05-06 ENCOUNTER — Encounter: Payer: Self-pay | Admitting: Nurse Practitioner

## 2021-05-06 VITALS — BP 110/72 | HR 60 | Temp 98.5°F | Resp 18 | Ht 72.0 in | Wt 147.0 lb

## 2021-05-06 DIAGNOSIS — K219 Gastro-esophageal reflux disease without esophagitis: Secondary | ICD-10-CM

## 2021-05-06 DIAGNOSIS — E041 Nontoxic single thyroid nodule: Secondary | ICD-10-CM | POA: Diagnosis not present

## 2021-05-06 DIAGNOSIS — Z131 Encounter for screening for diabetes mellitus: Secondary | ICD-10-CM

## 2021-05-06 DIAGNOSIS — J309 Allergic rhinitis, unspecified: Secondary | ICD-10-CM | POA: Diagnosis not present

## 2021-05-06 DIAGNOSIS — Z130 Encounter for screening for diseases of the blood and blood-forming organs and certain disorders involving the immune mechanism: Secondary | ICD-10-CM

## 2021-05-06 DIAGNOSIS — Z7689 Persons encountering health services in other specified circumstances: Secondary | ICD-10-CM

## 2021-05-06 MED ORDER — OMEPRAZOLE 20 MG PO CPDR: 20.0000 mg | DELAYED_RELEASE_CAPSULE | Freq: Every day | ORAL | 3 refills | Status: AC

## 2021-05-06 MED ORDER — LORATADINE 10 MG PO TABS: 10.0000 mg | ORAL_TABLET | Freq: Every day | ORAL | 11 refills | Status: AC

## 2021-05-06 NOTE — Addendum Note (Signed)
Addended by: Della Goo F on: 05/06/2021 04:07 PM   Modules accepted: Orders

## 2021-05-06 NOTE — Progress Notes (Addendum)
BP 110/72   Pulse 60   Temp 98.5 F (36.9 C) (Oral)   Resp 18   Ht 6' (1.829 m)   Wt 147 lb (66.7 kg)   SpO2 98%   BMI 19.94 kg/m    Subjective:    Patient ID: Ralph Edwards, male    DOB: 14-Apr-1991, 30 y.o.   MRN: [id number]  HPI: ASHAZ ROBLING is a 30 y.o. male, here alone  Chief Complaint  Patient presents with   Establish Care   Allergic Rhinitis    Gastroesophageal Reflux   Thyroid Problem    nodule   Establish care:  Unknown when last physical, sometime when he was a kid.   Allergic Rhinitis:  He says he can feel pressure in his sinuses.  He says it has been going on for awhile. Discussed OTC treatment for symptoms. He would like the allergy medication as a prescription.  Prescription sent.   GERD: Hot foods make worse.  He says that it happens a couple times a week but not every day.  Discussed avoiding triggers and not eating late at night or within an hour of laying down.  He says he has been on omeprazole before. Will restart omeprazole.  Thyroid: Noticed a thyroid nodule about three years ago. He says he has had palpitations sometimes and can not seem to gain weight.  He is not currently having palpitations and heart sounds are a normal rate and rhythm. Discussed getting lab work and thyroid ultrasound. He is agreeable to plan.   Relevant past medical, surgical, family and social history reviewed and updated as indicated. Interim medical history since our last visit reviewed. Allergies and medications reviewed and updated.  Review of Systems  Constitutional: Negative for fever, says he cannot gain weight   Respiratory: Negative for cough and shortness of breath.   Cardiovascular: Negative for chest pain, positive for palpitations.  Gastrointestinal: Negative for abdominal pain, no bowel changes.  Musculoskeletal: Negative for gait problem or joint swelling.  Skin: Negative for rash.  Neurological: Negative for dizziness or headache.  No other  specific complaints in a complete review of systems (except as listed in HPI above).      Objective:    BP 110/72   Pulse 60   Temp 98.5 F (36.9 C) (Oral)   Resp 18   Ht 6' (1.829 m)   Wt 147 lb (66.7 kg)   SpO2 98%   BMI 19.94 kg/m   Wt Readings from Last 3 Encounters:  05/06/21 147 lb (66.7 kg)  06/15/17 140 lb (63.5 kg)  11/03/16 145 lb (65.8 kg)    Physical Exam Constitutional: Patient appears well-developed and well-nourished. No distress.  HEENT: head atraumatic, normocephalic, pupils equal and reactive to light, ears effusion noted on right and left no redness noted, neck supple, nodule noted on right side of thyroid Cardiovascular: Normal rate, regular rhythm and normal heart sounds.  No murmur heard. No BLE edema. Pulmonary/Chest: Effort normal and breath sounds normal. No respiratory distress. Abdominal: Soft.  There is no tenderness. Psychiatric: Patient has a normal mood and affect. behavior is normal. Judgment and thought content normal.   Results for orders placed or performed in visit on 10/02/20  [facility] HIV SCREENING LAB  Result Value Ref Range   HM HIV Screening Negative - Validated   [facility] HEPATITIS C SCREENING LAB  Result Value Ref Range   HM Hepatitis Screen Negative-Validated       Assessment & Plan:  1. Gastroesophageal reflux disease without esophagitis -avoid triggers - omeprazole (PRILOSEC) 20 MG capsule; Take 1 capsule (20 mg total) by mouth daily.  Dispense: 30 capsule; Refill: 3  2. Thyroid nodule  - Thyroid Panel With TSH - US THYROID; Future  3. Screening for diabetes mellitus  - Comprehensive metabolic panel  4. Screening for deficiency anemia  - CBC with Differential/Platelet   5. Encounter to establish care -schedule cpe  Follow up plan: Return in about 3 months (around 08/04/2021) for cpe.

## 2021-05-06 NOTE — Telephone Encounter (Signed)
Please do not forget to send in his allergy medication to [facility]

## 2021-05-07 LAB — CBC WITH DIFFERENTIAL/PLATELET
Absolute Monocytes: 426 cells/uL (ref 200–950)
Basophils Absolute: 21 cells/uL (ref 0–200)
Basophils Relative: 0.4 %
Eosinophils Absolute: 109 cells/uL (ref 15–500)
Eosinophils Relative: 2.1 %
HCT: 39.5 % (ref 38.5–50.0)
Hemoglobin: 13.2 g/dL (ref 13.2–17.1)
Lymphs Abs: 2434 cells/uL (ref 850–3900)
MCH: 29.5 pg (ref 27.0–33.0)
MCHC: 33.4 g/dL (ref 32.0–36.0)
MCV: 88.2 fL (ref 80.0–100.0)
MPV: 12.7 fL — ABNORMAL HIGH (ref 7.5–12.5)
Monocytes Relative: 8.2 %
Neutro Abs: 2210 cells/uL (ref 1500–7800)
Neutrophils Relative %: 42.5 %
Platelets: 86 10*3/uL — ABNORMAL LOW (ref 140–400)
RBC: 4.48 10*6/uL (ref 4.20–5.80)
RDW: 12.5 % (ref 11.0–15.0)
Total Lymphocyte: 46.8 %
WBC: 5.2 10*3/uL (ref 3.8–10.8)

## 2021-05-07 LAB — COMPREHENSIVE METABOLIC PANEL
AG Ratio: 1.6 (calc) (ref 1.0–2.5)
ALT: 15 U/L (ref 9–46)
AST: 19 U/L (ref 10–40)
Albumin: 3.9 g/dL (ref 3.6–5.1)
Alkaline phosphatase (APISO): 65 U/L (ref 36–130)
BUN: 13 mg/dL (ref 7–25)
CO2: 31 mmol/L (ref 20–32)
Calcium: 8.9 mg/dL (ref 8.6–10.3)
Chloride: 102 mmol/L (ref 98–110)
Creat: 1 mg/dL (ref 0.60–1.26)
Globulin: 2.4 g/dL (calc) (ref 1.9–3.7)
Glucose, Bld: 74 mg/dL (ref 65–99)
Potassium: 4.2 mmol/L (ref 3.5–5.3)
Sodium: 138 mmol/L (ref 135–146)
Total Bilirubin: 0.4 mg/dL (ref 0.2–1.2)
Total Protein: 6.3 g/dL (ref 6.1–8.1)

## 2021-05-07 LAB — THYROID PANEL WITH TSH
Free Thyroxine Index: 2.6 (ref 1.4–3.8)
T3 Uptake: 34 % (ref 22–35)
T4, Total: 7.7 ug/dL (ref 4.9–10.5)
TSH: 1.54 mIU/L (ref 0.40–4.50)

## 2021-05-14 ENCOUNTER — Ambulatory Visit
Admission: RE | Admit: 2021-05-14 | Discharge: 2021-05-14 | Disposition: A | Discharge status: Home / Self Care | Payer: BC Managed Care – PPO | Source: Ambulatory Visit | Attending: Nurse Practitioner | Admitting: Nurse Practitioner

## 2021-05-14 ENCOUNTER — Other Ambulatory Visit: Payer: Self-pay

## 2021-05-14 DIAGNOSIS — E041 Nontoxic single thyroid nodule: Secondary | ICD-10-CM | POA: Diagnosis present

## 2021-05-15 ENCOUNTER — Other Ambulatory Visit: Payer: Self-pay | Admitting: Nurse Practitioner

## 2021-05-15 DIAGNOSIS — E041 Nontoxic single thyroid nodule: Secondary | ICD-10-CM

## 2021-05-24 ENCOUNTER — Telehealth: Payer: Self-pay

## 2021-05-24 ENCOUNTER — Other Ambulatory Visit: Payer: Self-pay | Admitting: Nurse Practitioner

## 2021-05-24 DIAGNOSIS — E041 Nontoxic single thyroid nodule: Secondary | ICD-10-CM

## 2021-05-24 NOTE — Telephone Encounter (Signed)
Copied from CRM [id number]. Topic: General - Other >> May 24, 2021 11:05 AM Marylen Ponto wrote: Reason for CRM: Pt stated he has yet to hear from anyone regarding scheduling appt for biopsy. Pt requests call back asap. Cb# [phone]

## 2021-05-24 NOTE — Telephone Encounter (Signed)
Called patient mailbox is full. Called Endocrinology at [facility] they are not taking any new patients. Patient will be referred to Endocrinology in [facility]

## 2021-05-24 NOTE — Telephone Encounter (Signed)
Patient called in and I informed that the referral has been placed to [facility] in [place].  Patient verbalized understanding and will wait for them to call and schedule.

## 2021-05-28 NOTE — Telephone Encounter (Signed)
Referral office notified and will schedule patient new appointment

## 2021-05-31 ENCOUNTER — Other Ambulatory Visit: Payer: Self-pay | Admitting: Internal Medicine

## 2021-05-31 ENCOUNTER — Other Ambulatory Visit (HOSPITAL_COMMUNITY): Payer: Self-pay | Admitting: Internal Medicine

## 2021-06-03 ENCOUNTER — Other Ambulatory Visit: Payer: Self-pay | Admitting: Internal Medicine

## 2021-06-03 DIAGNOSIS — E041 Nontoxic single thyroid nodule: Secondary | ICD-10-CM

## 2021-06-05 ENCOUNTER — Telehealth: Payer: Self-pay

## 2021-06-07 ENCOUNTER — Ambulatory Visit: Payer: BC Managed Care – PPO

## 2021-06-21 ENCOUNTER — Other Ambulatory Visit: Payer: Self-pay

## 2021-06-21 ENCOUNTER — Ambulatory Visit
Admission: RE | Admit: 2021-06-21 | Discharge: 2021-06-21 | Disposition: A | Discharge status: Home / Self Care | Payer: BC Managed Care – PPO | Source: Ambulatory Visit | Attending: Internal Medicine | Admitting: Internal Medicine

## 2021-06-21 DIAGNOSIS — E041 Nontoxic single thyroid nodule: Secondary | ICD-10-CM | POA: Insufficient documentation

## 2021-07-09 ENCOUNTER — Encounter: Payer: Self-pay | Admitting: Family Medicine

## 2021-07-09 ENCOUNTER — Ambulatory Visit: Payer: Self-pay | Admitting: Family Medicine

## 2021-07-09 ENCOUNTER — Other Ambulatory Visit: Payer: Self-pay

## 2021-07-09 DIAGNOSIS — Z113 Encounter for screening for infections with a predominantly sexual mode of transmission: Secondary | ICD-10-CM

## 2021-07-09 DIAGNOSIS — Z720 Tobacco use: Secondary | ICD-10-CM

## 2021-07-09 LAB — GRAM STAIN

## 2021-07-09 NOTE — Progress Notes (Signed)
Pt here for STD screening.  Gram stain results reviewed, no treatment required per SO.  Pt declined condoms.  Windle Guard, RN

## 2021-07-09 NOTE — Progress Notes (Signed)
[facility] Department STI clinic/screening visit  Subjective:  Ralph Edwards is a 31 y.o. male being seen today for an STI screening visit. The patient reports they do not have symptoms.    Patient has the following medical conditions:  There are no problems to display for this patient.    Chief Complaint  Patient presents with   SEXUALLY TRANSMITTED DISEASE    Screening     HPI  Patient reports here for screening, denies s/sx   Does the patient or their partner desires a pregnancy in the next year? No  Screening for MPX risk: Does the patient have an unexplained rash? No Is the patient MSM? No Does the patient endorse multiple sex partners or anonymous sex partners? No Did the patient have close or sexual contact with a person diagnosed with MPX? No Has the patient traveled outside the [place] where MPX is endemic? No Is there a high clinical suspicion for MPX-- evidenced by one of the following No  -Unlikely to be chickenpox  -Lymphadenopathy  -Rash that present in same phase of evolution on any given body part   See flowsheet for further details and programmatic requirements.    The following portions of the patient's history were reviewed and updated as appropriate: allergies, current medications, past medical history, past social history, past surgical history and problem list.  Objective:  There were no vitals filed for this visit.  Physical Exam Constitutional:      Appearance: Normal appearance.  HENT:     Head: Normocephalic.     Mouth/Throat:     Mouth: Mucous membranes are moist.     Pharynx: Oropharynx is clear. No oropharyngeal exudate.  Pulmonary:     Effort: Pulmonary effort is normal.  Genitourinary:    Penis: Normal.      Testes: Normal.     Comments: No lice, nits, or pest, no lesions or odor discharge.  Denies pain or tenderness with paplation of testicles.  No lesions, ulcers or masses present.    Musculoskeletal:      Cervical back: Normal range of motion.  Lymphadenopathy:     Cervical: No cervical adenopathy.     Lower Body: Right inguinal adenopathy present. Left inguinal adenopathy present.  Skin:    General: Skin is warm and dry.     Findings: No bruising, erythema, lesion or rash.  Neurological:     Mental Status: He is alert and oriented to person, place, and time.  Psychiatric:        Mood and Affect: Mood normal.        Behavior: Behavior normal.      Assessment and Plan:  Ralph Edwards is a 31 y.o. male presenting to the [facility] Department for STI screening  1. Screening examination for venereal disease Patient does not have STI symptoms Patient accepted all screenings including  gram stain, urethral GC and bloodwork for HIV/RPR.  Patient meets criteria for HepB screening? Yes. Ordered? No - declined  Patient meets criteria for HepC screening? Yes. Ordered? No - declined  Recommended condom use with all sex Discussed importance of condom use for STI prevent  Treat gram stain per standing order Discussed time line for [facility] results and that patient will be called with positive results and encouraged patient to call if he had not heard in 2 weeks Recommended returning for continued or worsening symptoms.   - HIV [place] [facility] - Syphilis Serology, [place] [facility] - Gonococcus  culture - Gram stain  2. Tobacco use Patient desires to quit smoking tobacco, Quitline information given to patient.    Return for 28 week labs.  No future appointments.  Wendi Snipes, FNP

## 2021-07-13 LAB — GONOCOCCUS CULTURE

## 2021-07-15 ENCOUNTER — Encounter: Payer: Self-pay | Admitting: Internal Medicine

## 2021-07-15 LAB — CYTOLOGY - NON PAP

## 2021-07-17 ENCOUNTER — Telehealth: Payer: Self-pay

## 2021-07-17 NOTE — Telephone Encounter (Signed)
Done    Copied from CRM [id number]. Topic: General - Call Back - No Documentation >> Jul 17, 2021  9:46 AM Randol Kern wrote: Reason for CRM: Pt wants to discuss his biopsy results for his recent thyroid labs, best contact: [phone] Please advise if [facility] may disclose

## 2021-07-18 NOTE — Telephone Encounter (Signed)
Attempted to contact patient,no voicemail set up to leave a message.

## 2021-10-01 ENCOUNTER — Ambulatory Visit: Payer: BC Managed Care – PPO | Admitting: Internal Medicine

## 2021-10-01 NOTE — Progress Notes (Deleted)
Name: Ralph Edwards  MRN/ DOB: [id number], [birth year]    Age/ Sex: 31 y.o., male    PCP: Bo Merino, FNP   Reason for Endocrinology Evaluation: MNG     Date of Initial Endocrinology Evaluation: 10/01/2021     HPI: Mr. Ralph Edwards is a 31 y.o. male with a past medical history of MNG. The patient presented for initial endocrinology clinic visit on 10/01/2021 for consultative assistance with his MNG.   Patient has been diagnosed with multinodular goiter based on thyroid ultrasound in December 2022.  Right inferior 2 cm nodule met FNA criteria.  He is status post FNA on 06/21/2021 with a cytology report consistent with Bethesda category III (atypia of undetermined significance.)  ThyroSeq was negative    HISTORY:  Past Medical History:  Past Medical History:  Diagnosis Date   Acid reflux    Allergy    Past Surgical History: No past surgical history on file.  Social History:  reports that he has been smoking e-cigarettes and cigarettes. He has a 1.50 pack-year smoking history. He has never used smokeless tobacco. He reports that he does not currently use alcohol. He reports current drug use. Frequency: 1.00 time per week. Drug: Marijuana. Family History: family history is not on file.   HOME MEDICATIONS: Allergies as of 10/01/2021   No Known Allergies      Medication List        Accurate as of Oct 01, 2021  9:57 AM. If you have any questions, ask your nurse or doctor.          amoxicillin 500 MG tablet Commonly known as: AMOXIL Take 500 mg by mouth 3 (three) times daily.   amoxicillin 875 MG tablet Commonly known as: AMOXIL Take 1 tablet (875 mg total) by mouth 2 (two) times daily.   loratadine 10 MG tablet Commonly known as: CLARITIN Take 1 tablet (10 mg total) by mouth daily.   omeprazole 20 MG capsule Commonly known as: PRILOSEC Take 1 capsule (20 mg total) by mouth daily.          REVIEW OF SYSTEMS: A comprehensive ROS was  conducted with the patient and is negative except as per HPI and below:  ROS     OBJECTIVE:  VS: There were no vitals taken for this visit.   Wt Readings from Last 3 Encounters:  05/06/21 147 lb (66.7 kg)  06/15/17 140 lb (63.5 kg)  11/03/16 145 lb (65.8 kg)     EXAM: General: Pt appears well and is in NAD  Neck: General: Supple without adenopathy. Thyroid: Thyroid size normal.  No goiter or nodules appreciated.   Lungs: Clear with good BS bilat with no rales, rhonchi, or wheezes  Heart: Auscultation: RRR.  Abdomen: Normoactive bowel sounds, soft, nontender, without masses or organomegaly palpable  Extremities:  BL LE: No pretibial edema normal ROM and strength.  Mental Status: Judgment, insight: Intact Orientation: Oriented to time, place, and person Mood and affect: No depression, anxiety, or agitation     DATA REVIEWED: ***   Thyroid ultrasound 05/14/2021  Estimated total number of nodules >/= 1 cm: 1   Number of spongiform nodules >/=  2 cm not described below (TR1): 0   Number of mixed cystic and solid nodules >/= 1.5 cm not described below ([place]): 0   _________________________________________________________   Nodule # 1:   Location: Right; Inferior   Maximum size: 2.0 cm; Other 2 dimensions: 1.7 x 1.1 cm  Composition: solid/almost completely solid (2)   Echogenicity: hypoechoic (2)   Shape: not taller-than-wide (0)   Margins: ill-defined (0)   Echogenic foci: none (0)   ACR TI-RADS total points: 4.   ACR TI-RADS risk category: TR4 (4-6 points).   ACR TI-RADS recommendations:   **Given size (>/= 1.5 cm) and appearance, fine needle aspiration of this moderately suspicious nodule should be considered based on TI-RADS criteria.   _________________________________________________________   Scattered left thyroid benign cystic nodules noted, all measuring 9 mm or less. These would not meet criteria dedicated follow-up and are not fully  described by TI rads criteria.   No hypervascularity.  No regional adenopathy.   IMPRESSION: 2 cm right inferior TR 4 nodule meets criteria for biopsy as above.   The above is in keeping with the ACR TI-RADS recommendations - J Am Coll Radiol 2017;14:587-595.     FNA right inferior nodule 06/21/2021  DIAGNOSIS:  A. THYROID, RIGHT INFERIOR NODULE; ULTRASOUND-GUIDED FINE NEEDLE  ASPIRATION:  - FOLLICULAR LESION OF UNDETERMINED SIGNIFICANCE (BETHESDA CATEGORY  III).  - PREDOMINANTLY MICROFOLLICLES IN A BACKGROUND LACKING SIGNIFICANT  COLLOID.    ASSESSMENT/PLAN/RECOMMENDATIONS:   Multinodular Goiter:    Medications :  Signed electronically by: Mack Guise, MD  [facility]  [address] [place], [place] [address] Phone: [phone] FAX: [phone]   CC: Bo Merino, [place] [address] [place] [address] Phone: [phone] Fax: [phone]   Return to Endocrinology clinic as below: Future Appointments  Date Time Provider [place]  10/01/2021  1:00 PM Langdon Crosson, Melanie Crazier, MD [id number] None

## 2022-06-03 ENCOUNTER — Other Ambulatory Visit: Payer: Self-pay | Admitting: Nurse Practitioner

## 2022-06-03 DIAGNOSIS — E041 Nontoxic single thyroid nodule: Secondary | ICD-10-CM

## 2022-06-05 ENCOUNTER — Ambulatory Visit
Admission: RE | Admit: 2022-06-05 | Discharge: 2022-06-05 | Disposition: A | Discharge status: Home / Self Care | Payer: BC Managed Care – PPO | Source: Ambulatory Visit | Attending: Nurse Practitioner | Admitting: Nurse Practitioner

## 2022-06-05 DIAGNOSIS — E041 Nontoxic single thyroid nodule: Secondary | ICD-10-CM

## 2022-09-01 IMAGING — US US THYROID
1 series · 13 of 25 positions shown · non-contrast
Comparison: None.

CLINICAL DATA: Thyroid nodule

EXAM:
THYROID ULTRASOUND
TECHNIQUE: Ultrasound examination of the thyroid gland and adjacent soft
tissues was performed.

[Series 1: us thyroid · 0.06mm/px · 13 of 74 slices shown]
[im 1/74]
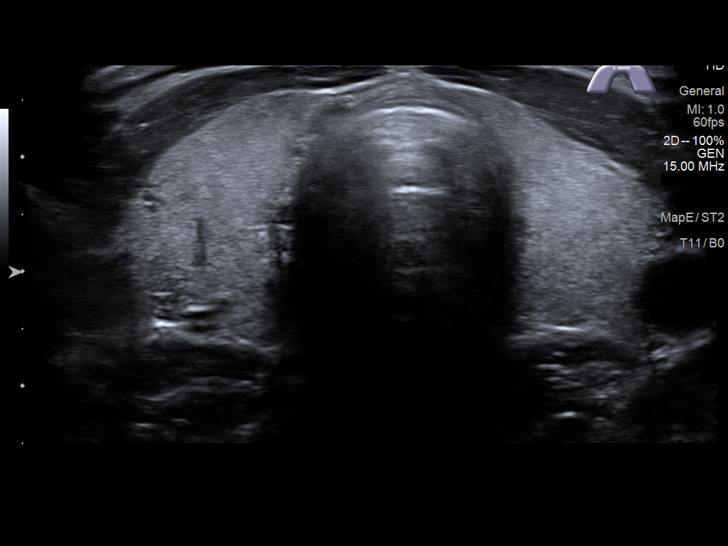
[im 7/74]
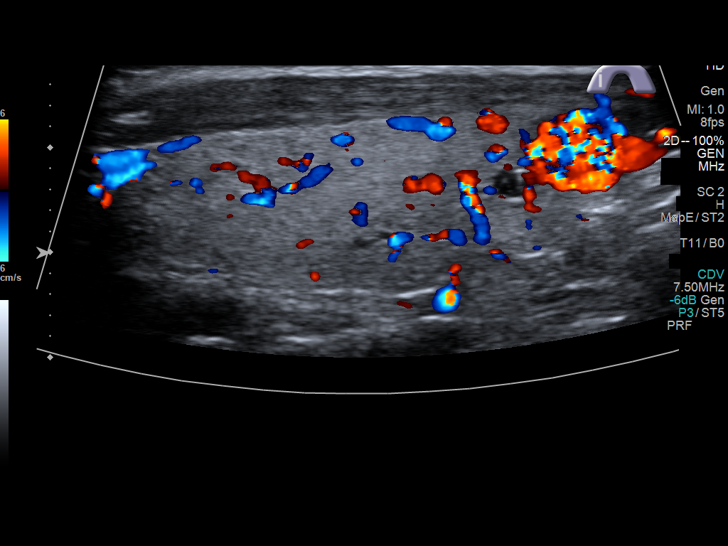
[im 13/74]
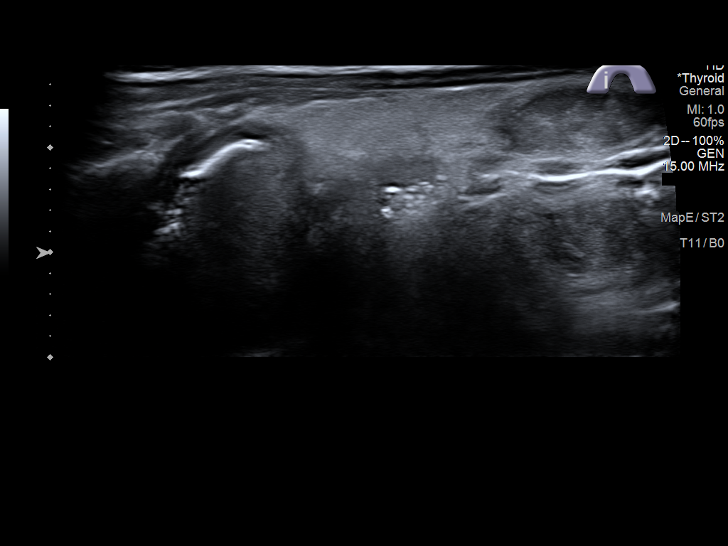
[im 19/74]
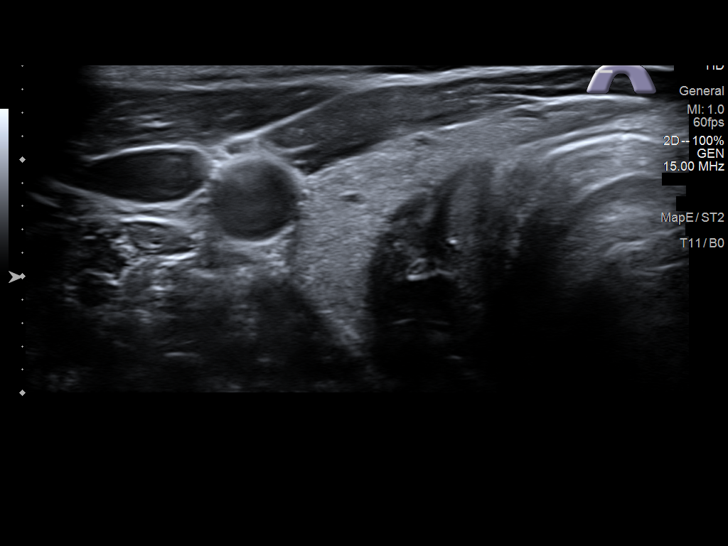
[im 25/74]
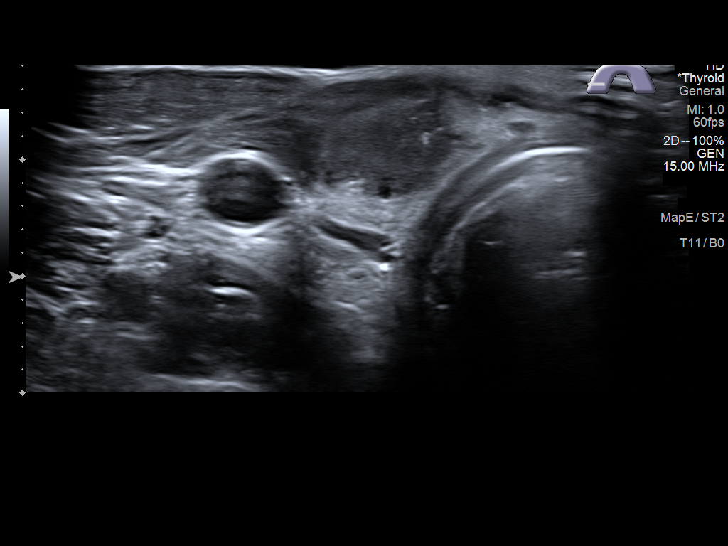
[im 31/74]
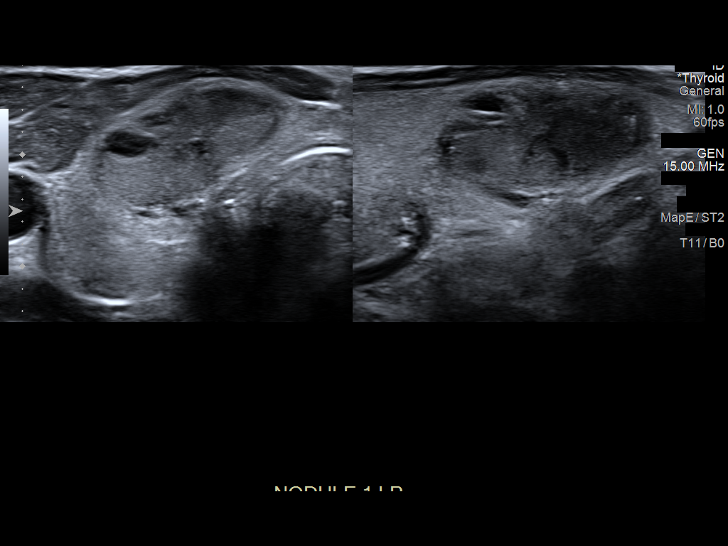
[im 37/74]
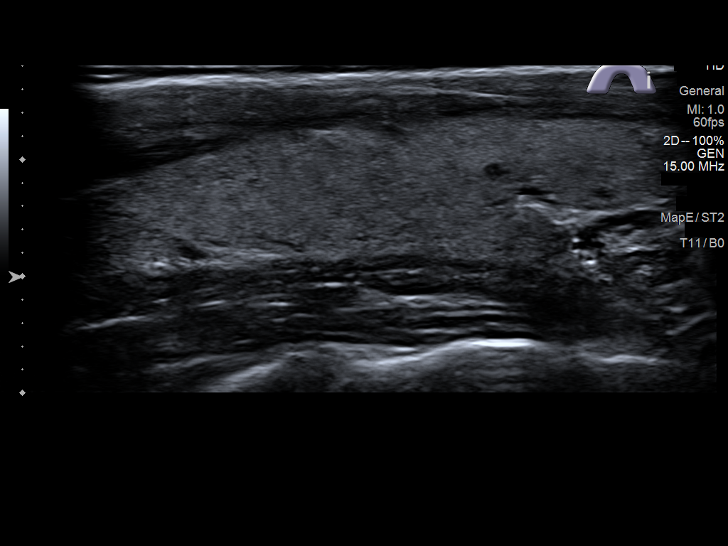
[im 43/74]
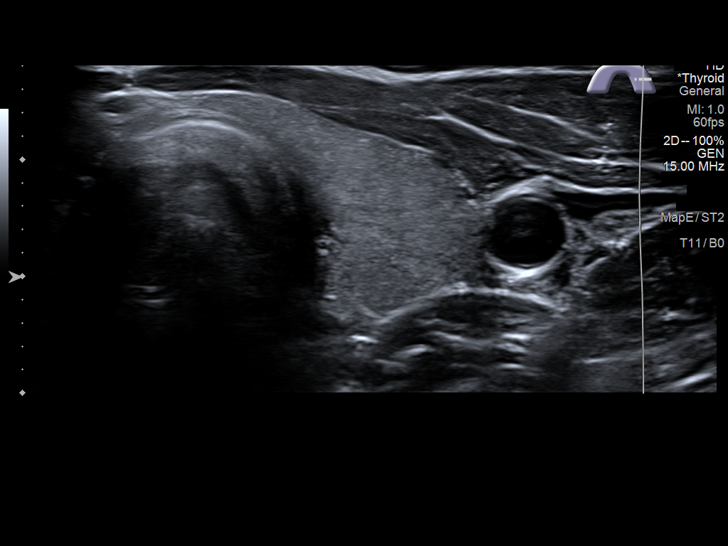
[im 49/74]
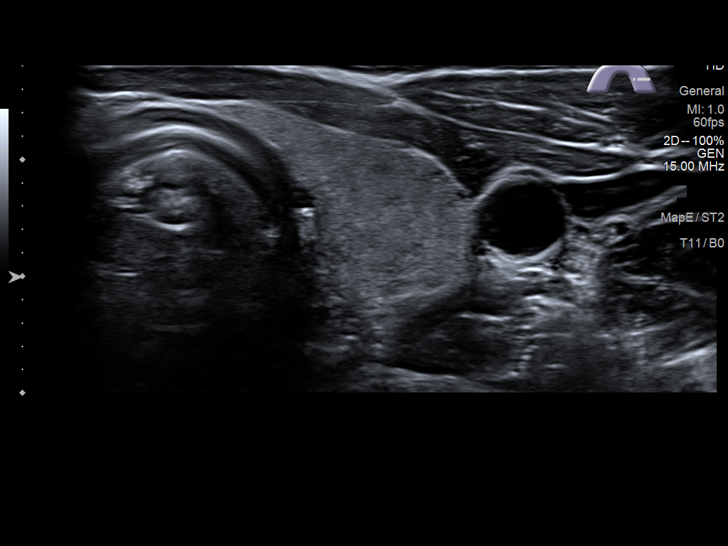
[im 55/74]
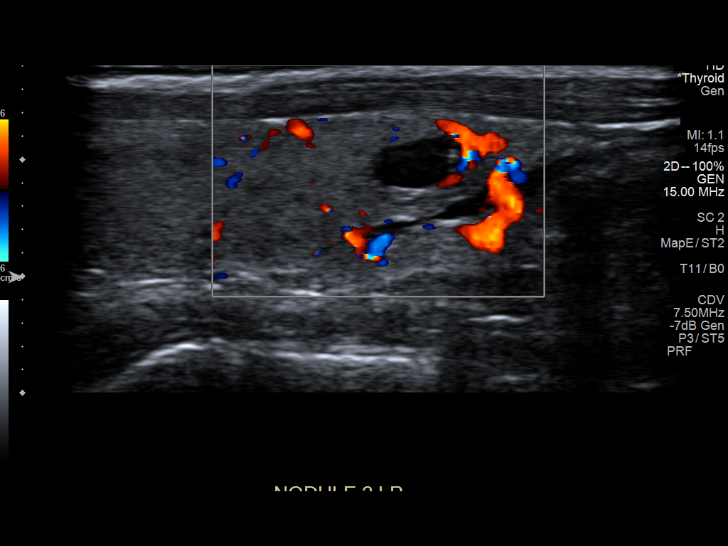
[im 61/74]
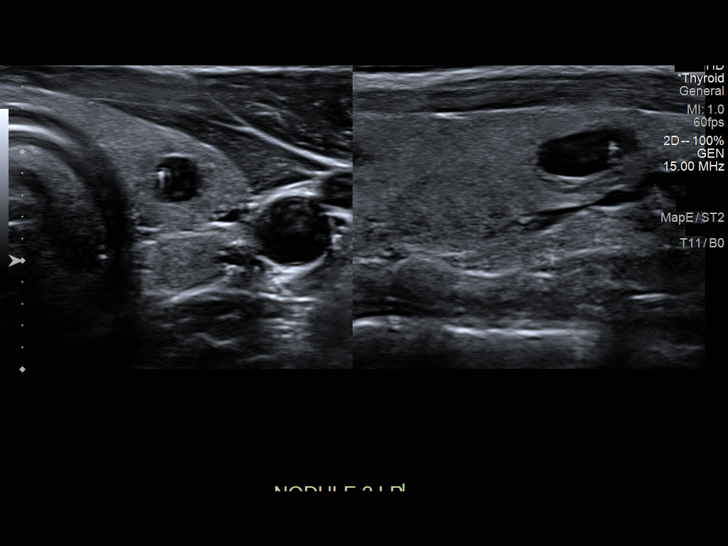
[im 67/74]
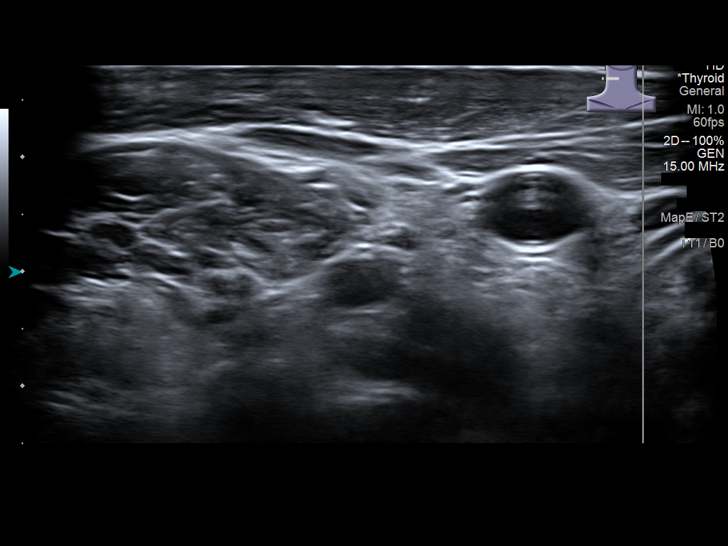
[im 74/74]
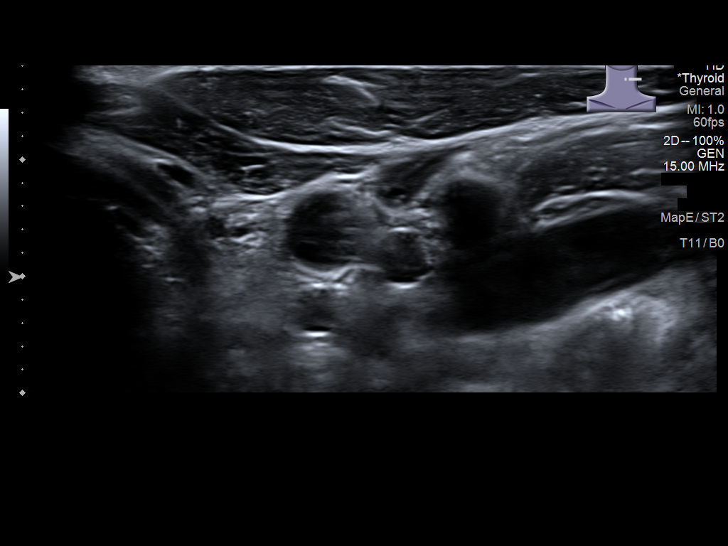

[13 of 25 positions shown; findings below may reference images not displayed]

FINDINGS: Parenchymal Echotexture: Mildly heterogenous

Isthmus: 2 mm

Right lobe: 5.8 x 1.8 x 1.8 cm

Left lobe: 5.0 x 1.4 x 1 4 cm

_________________________________________________________

Estimated total number of nodules >/= 1 cm: 1

Number of spongiform nodules >/=  2 cm not described below (TR1): 0

Number of mixed cystic and solid nodules >/= 1.5 cm not described
below (TR2): 0

_________________________________________________________

Nodule # 1:

Location: Right; Inferior

Maximum size: 2.0 cm; Other 2 dimensions: 1.7 x 1.1 cm

Composition: solid/almost completely solid (2)

Echogenicity: hypoechoic (2)

Shape: not taller-than-wide (0)

Margins: ill-defined (0)

Echogenic foci: none (0)

ACR TI-RADS total points: 4.

ACR TI-RADS risk category: TR4 (4-6 points).

ACR TI-RADS recommendations:

**Given size (>/= 1.5 cm) and appearance, fine needle aspiration of
this moderately suspicious nodule should be considered based on
TI-RADS criteria.

_________________________________________________________

Scattered left thyroid benign cystic nodules noted, all measuring 9
mm or less. These would not meet criteria dedicated follow-up and
are not fully described by TI rads criteria.

No hypervascularity.  No regional adenopathy.
IMPRESSION: 2 cm right inferior TR 4 nodule meets criteria for biopsy as above.

The above is in keeping with the ACR TI-RADS recommendations - [HOSPITAL] 8105;[DATE].

## 2022-10-09 IMAGING — US US FNA BIOPSY THYROID 1ST LESION
1 series · 13 of 16 positions shown · non-contrast
Comparison: Ultrasound thyroid 05/14/2021

MEDICATIONS:
1% lidocaine 2 mL

COMPLICATIONS:
None immediate.

INDICATION: Indeterminate right inferior thyroid nodule

EXAM:
ULTRASOUND GUIDED FINE NEEDLE ASPIRATION OF INDETERMINATE THYROID
NODULE
TECHNIQUE: Informed written consent was obtained from the patient after a
discussion of the risks, benefits and alternatives to treatment.
Questions regarding the procedure were encouraged and answered. A
timeout was performed prior to the initiation of the procedure.

[Series 1: us fna biopsy thyroid 1st lesion · 0.07mm/px · 16 acquisitions, 13 frames shown]
[im 1/16]
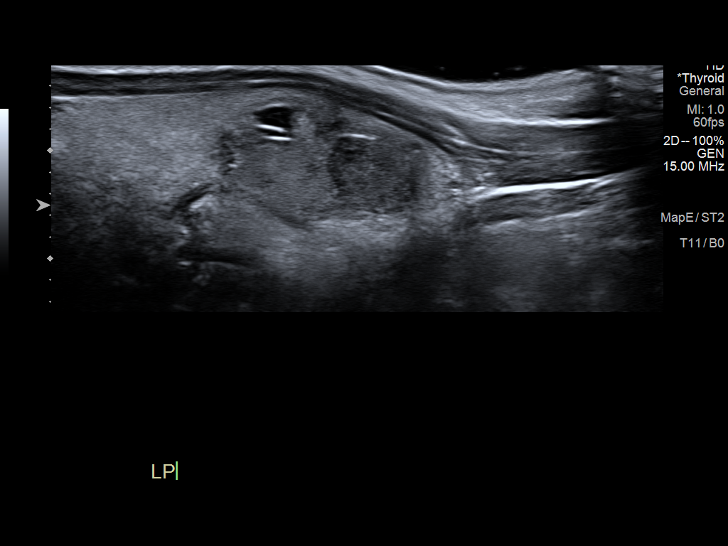
[im 2/16]
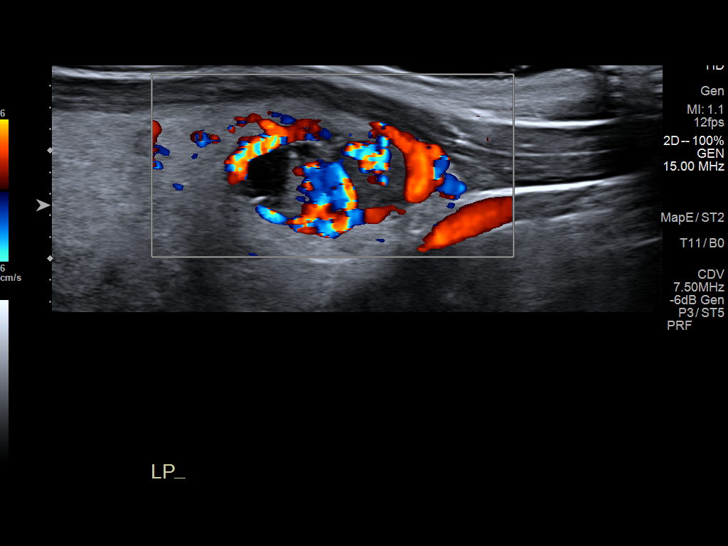
[im 4/16]
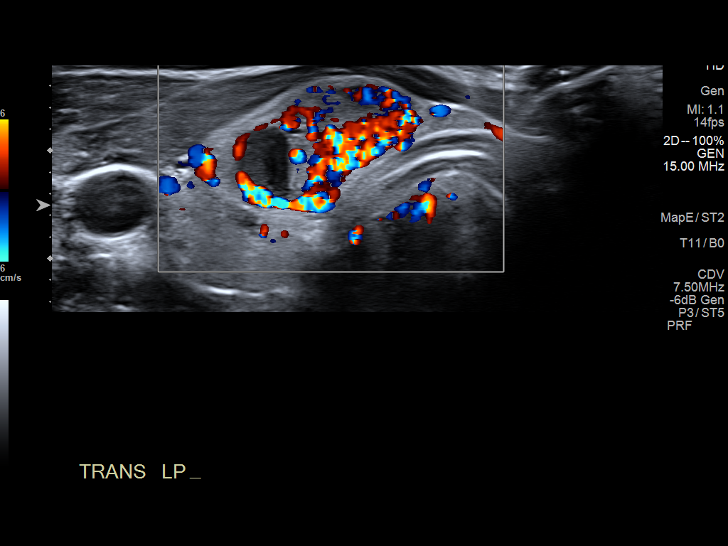
[im 5/16]
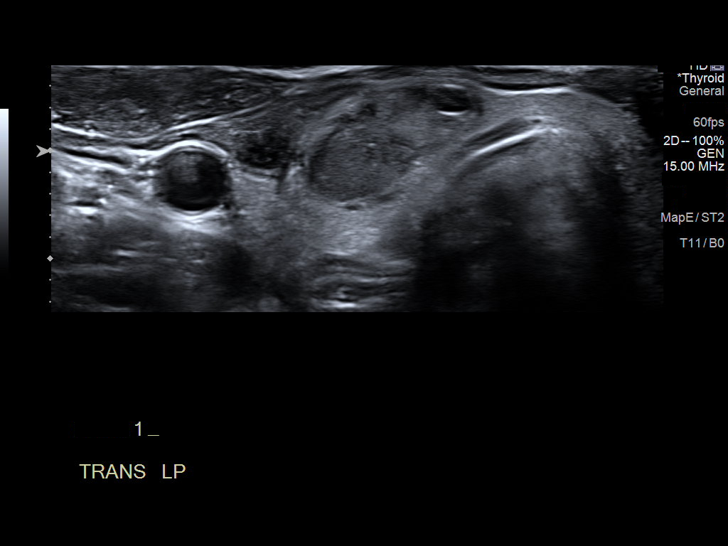
[im 6/16]
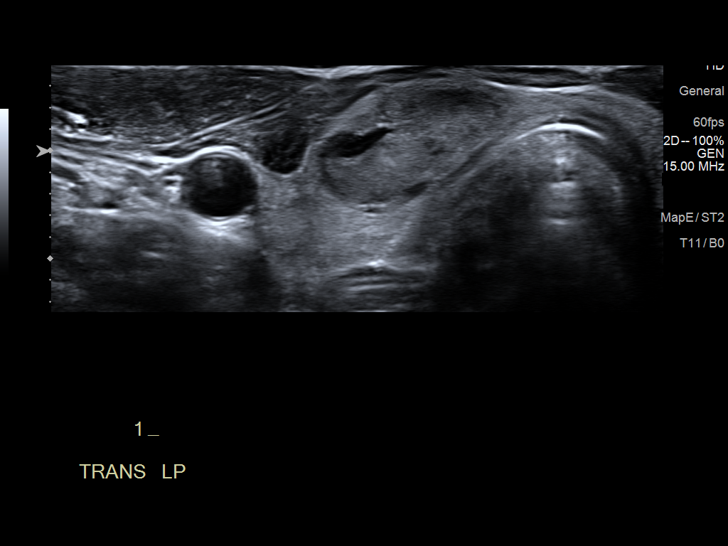
[im 7/16]
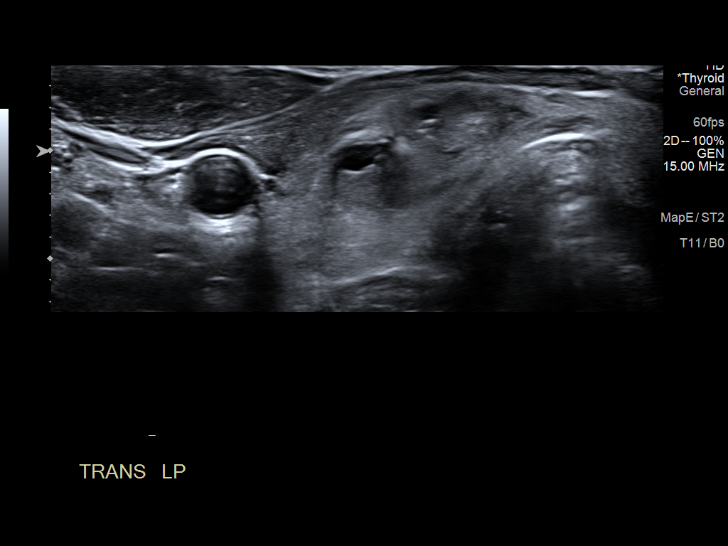
[im 9/16]
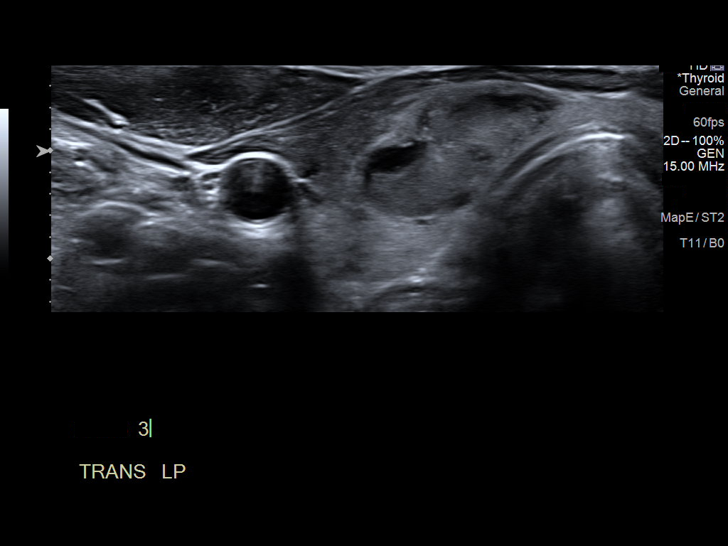
[im 10/16]
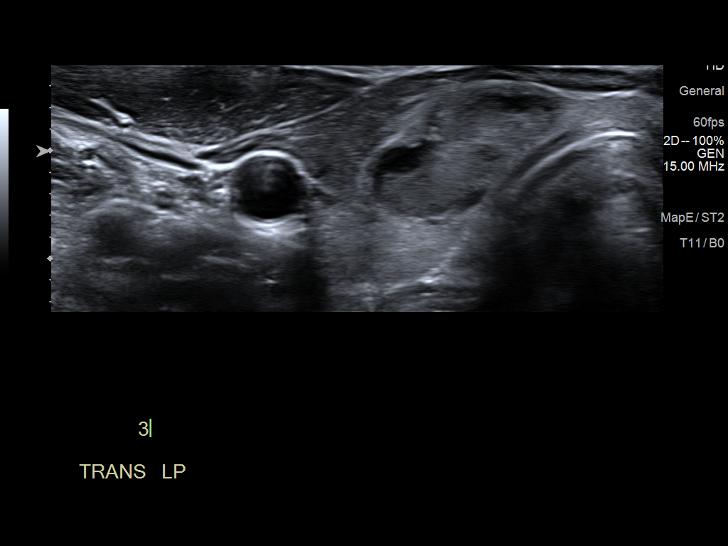
[im 11/16]
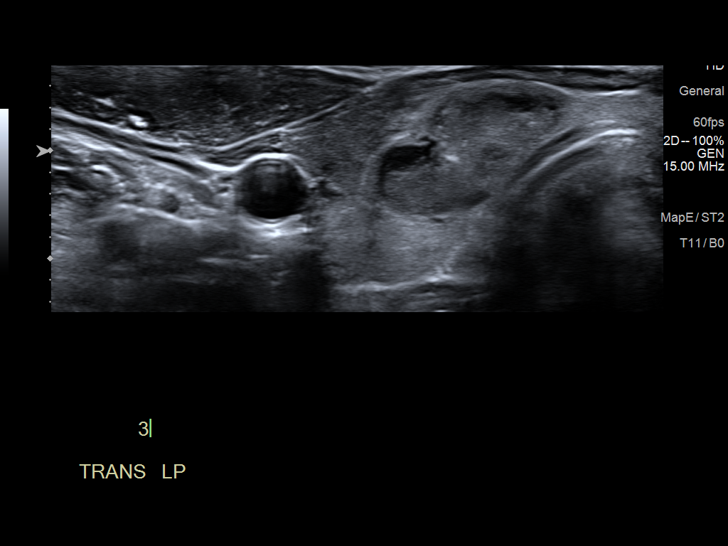
[im 12/16]
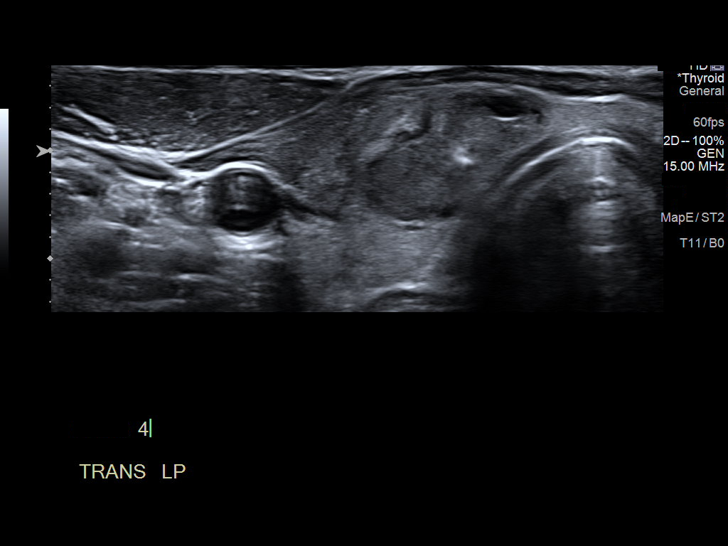
[im 13/16]
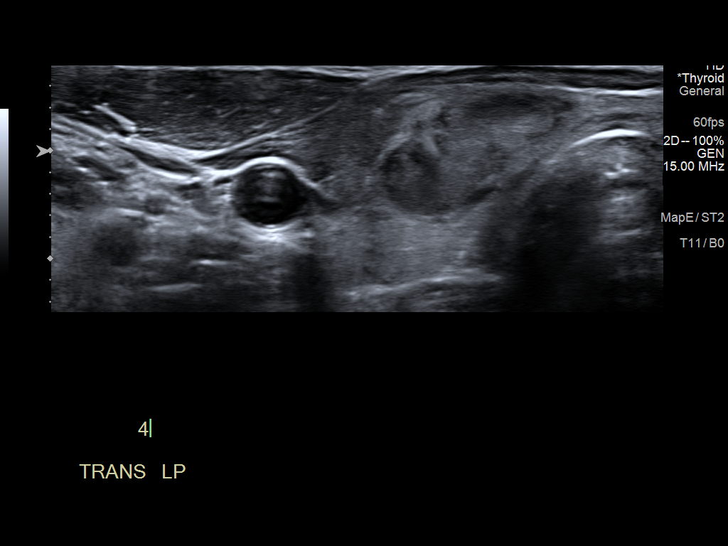
[im 15/16]
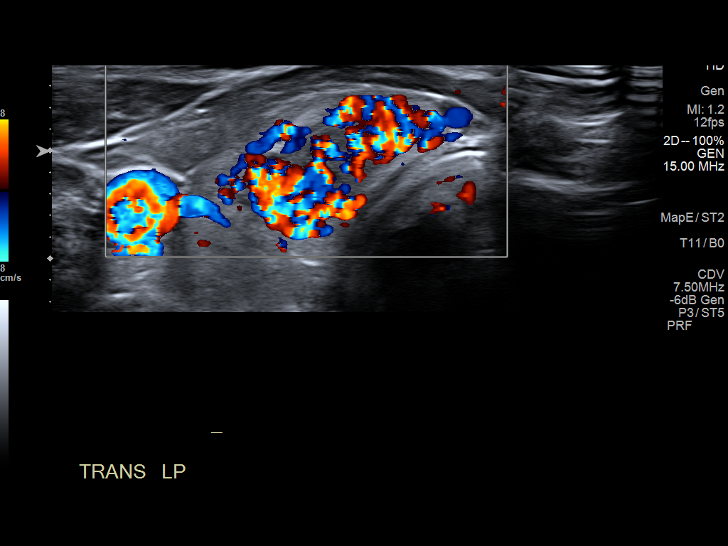
[im 16/16]
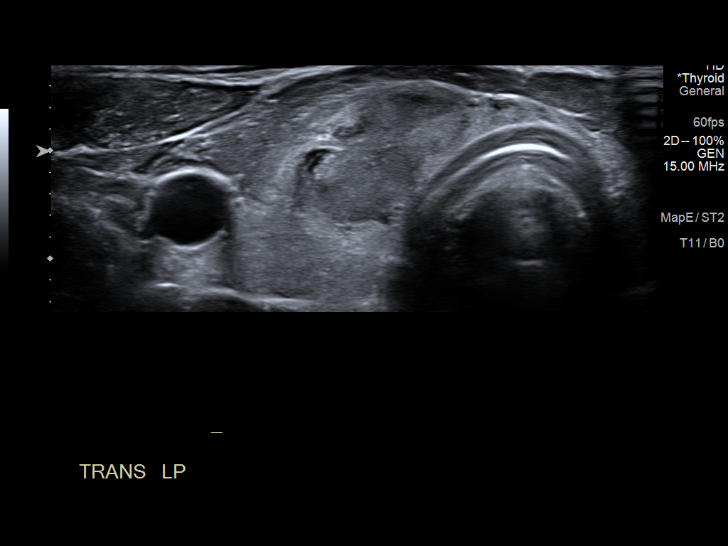

[13 of 16 positions shown; findings below may reference images not displayed]

Pre-procedural ultrasound scanning demonstrated unchanged size and
appearance of the indeterminate nodule within the right inferior
thyroid.

The procedure was planned. The neck was prepped in the usual sterile
fashion, and a sterile drape was applied covering the operative
field. A timeout was performed prior to the initiation of the
procedure. Local anesthesia was provided with 1% lidocaine.

Under direct ultrasound guidance, 4 FNA biopsies were performed of
the right inferior thyroid nodule with a 25 gauge needle. Multiple
ultrasound images were saved for procedural documentation purposes.
The samples were prepared and submitted to pathology.

Limited post procedural scanning was negative for hematoma or
additional complication. Dressings were placed. The patient
tolerated the above procedures procedure well without immediate
postprocedural complication.
FINDINGS: Nodule reference number based on prior diagnostic ultrasound: 1

Maximum size: 2 cm

Location: Right; Inferior

ACR TI-RADS risk category: TR4 (4-6 points)

Reason for biopsy: meets ACR TI-RADS criteria

Ultrasound imaging confirms appropriate placement of the needles
within the thyroid nodule.
IMPRESSION: Technically successful ultrasound guided fine needle aspiration of
right inferior thyroid nodule. Read by: Lorenz Jumper, NP

## 2023-03-09 ENCOUNTER — Ambulatory Visit: Payer: BC Managed Care – PPO | Admitting: Nurse Practitioner

## 2023-03-13 ENCOUNTER — Ambulatory Visit: Payer: BC Managed Care – PPO | Admitting: Nurse Practitioner

## 2023-03-13 DIAGNOSIS — E0590 Thyrotoxicosis, unspecified without thyrotoxic crisis or storm: Secondary | ICD-10-CM | POA: Insufficient documentation

## 2023-03-13 NOTE — Progress Notes (Deleted)
   There were no vitals taken for this visit.   Subjective:    Patient ID: Ralph Edwards, male    DOB: 1990/06/01, 32 y.o.   MRN: [id number]  HPI: Ralph Edwards is a 32 y.o. male, here alone  No chief complaint on file.  Subclinical hyperthyroidism/Thyroid nodule: Noticed a thyroid nodule more than three years ago. He had reported that he has had palpitations sometimes and cannot seem to gain weight.  He had reported at last appointment that he was not currently having palpitations. Labs were completed and thyroid ultrasound was completed.  Thyroid panel was normal. Thyroid ultrasound : IMPRESSION: 2 cm right inferior TR 4 nodule meets criteria for biopsy as above. Referral to endocrinology was sent. Patient had biopsy done and has had follow ups with endocrinology. Last appointment was 06/02/2022. Notes: Have reviewed most recent thyroid ultrasound: Will order a follow up thyroid ultrasound.   TSH pending today to check thyroid function.  Will plan on annual thyroid ultrasound and sooner if he developed any dysphagia, odynophagia, an increase in neck size or pressure symptoms around the neck.  Most recent ultrasound was done on 06/05/2022: IMPRESSION: Stable bilateral thyroid nodules. Neither currently meets criteriafor biopsy or follow-up.   Endocrinologist is : [facility] [phone] [address] [place], [place] [address]   Relevant past medical, surgical, family and social history reviewed and updated as indicated. Interim medical history since our last visit reviewed. Allergies and medications reviewed and updated.  Review of Systems  Constitutional: Negative for fever, says he cannot gain weight   Respiratory: Negative for cough and shortness of breath.   Cardiovascular: Negative for chest pain, positive for palpitations.  Gastrointestinal: Negative for abdominal pain, no bowel changes.  Musculoskeletal: Negative for gait  problem or joint swelling.  Skin: Negative for rash.  Neurological: Negative for dizziness or headache.  No other specific complaints in a complete review of systems (except as listed in HPI above).      Objective:    There were no vitals taken for this visit.  Wt Readings from Last 3 Encounters:  05/06/21 147 lb (66.7 kg)  06/15/17 140 lb (63.5 kg)  11/03/16 145 lb (65.8 kg)    Physical Exam Constitutional: Patient appears well-developed and well-nourished. No distress.  HEENT: head atraumatic, normocephalic, pupils equal and reactive to light, ears effusion noted on right and left no redness noted, neck supple, nodule noted on right side of thyroid Cardiovascular: Normal rate, regular rhythm and normal heart sounds.  No murmur heard. No BLE edema. Pulmonary/Chest: Effort normal and breath sounds normal. No respiratory distress. Abdominal: Soft.  There is no tenderness. Psychiatric: Patient has a normal mood and affect. behavior is normal. Judgment and thought content normal.   Results for orders placed or performed in visit on 07/09/21  Gonococcus culture   Specimen: Genital   UE  Result Value Ref Range   GC Culture Only Final report    Result 1 Comment   Gram stain  Result Value Ref Range   Gram Stain Result Comment:       Assessment & Plan:   Problem List Items Addressed This Visit   None    Follow up plan: No follow-ups on file.
